# Patient Record
Sex: Female | Born: 2000 | Race: Black or African American | Hispanic: No | Marital: Single | Smoking: Never smoker
Health system: Southern US, Community
[De-identification: ages and names within clinical notes are randomized; demographics above are authoritative.]

## PROBLEM LIST (undated history)

## (undated) ENCOUNTER — Emergency Department: Disposition: A | Discharge status: Home / Self Care | Payer: Exclusive Provider Organization

## (undated) ENCOUNTER — Ambulatory Visit (INDEPENDENT_AMBULATORY_CARE_PROVIDER_SITE_OTHER): Admission: RE | Payer: Self-pay

## (undated) DIAGNOSIS — Z889 Allergy status to unspecified drugs, medicaments and biological substances status: Secondary | ICD-10-CM

## (undated) DIAGNOSIS — M1990 Unspecified osteoarthritis, unspecified site: Secondary | ICD-10-CM

## (undated) DIAGNOSIS — Z9189 Other specified personal risk factors, not elsewhere classified: Secondary | ICD-10-CM

## (undated) DIAGNOSIS — S22000S Wedge compression fracture of unspecified thoracic vertebra, sequela: Secondary | ICD-10-CM

## (undated) DIAGNOSIS — Z87828 Personal history of other (healed) physical injury and trauma: Secondary | ICD-10-CM

## (undated) DIAGNOSIS — T7840XA Allergy, unspecified, initial encounter: Secondary | ICD-10-CM

## (undated) DIAGNOSIS — J383 Other diseases of vocal cords: Secondary | ICD-10-CM

## (undated) DIAGNOSIS — Q828 Other specified congenital malformations of skin: Secondary | ICD-10-CM

## (undated) DIAGNOSIS — L900 Lichen sclerosus et atrophicus: Secondary | ICD-10-CM

## (undated) HISTORY — PX: APPENDECTOMY (OPEN): SHX54

---
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
## 2018-04-30 ENCOUNTER — Emergency Department (HOSPITAL_COMMUNITY)
Admission: EM | Admit: 2018-04-30 | Discharge: 2018-05-01 | Disposition: A | Discharge status: Home / Self Care | Payer: Managed Care, Other (non HMO) | Attending: Emergency Medicine | Admitting: Emergency Medicine

## 2018-04-30 ENCOUNTER — Encounter: Payer: Self-pay | Admitting: Emergency Medicine

## 2018-04-30 ENCOUNTER — Emergency Department (HOSPITAL_COMMUNITY): Payer: Managed Care, Other (non HMO)

## 2018-04-30 ENCOUNTER — Other Ambulatory Visit: Payer: Self-pay

## 2018-04-30 DIAGNOSIS — M79672 Pain in left foot: Secondary | ICD-10-CM | POA: Diagnosis present

## 2018-04-30 NOTE — ED Provider Notes (Signed)
[facility]   CSN: [id number] Arrival date & time: 04/30/18  2055     History   Chief Complaint Chief Complaint  Patient presents with  . Foot Pain    HPI Carmen JamesKatherine Walsh is a 18 y.o. female.   18 year old female presents to the emergency department for evaluation of pain to her left foot.  She states that she was walking in high heels 1 week ago for many hours and began noticing some discomfort the following morning.  Pain is aggravated with ambulation.  She has not taken NSAIDs as she has a history of gastritis.  Also denies use of Tylenol.  She has applied ice without relief.  Patient denies any associated numbness.  She has had some intermittent paresthesias in her toes.  Is concerned that she may have a stress fracture.    The history is provided by the patient. No language interpreter was used.  Foot Pain     Past Medical History:  Diagnosis Date  . Arthritis   . H/O multiple allergies     There are no active problems to display for this patient.   No past surgical history on file.   OB History   No obstetric history on file.      Home Medications    Prior to Admission medications   Medication Sig Start Date End Date Taking? Authorizing Provider  diclofenac sodium (VOLTAREN) 1 % GEL Apply 2 g topically 4 (four) times daily. 05/01/18   Antony MaduraHumes, Yisroel Mullendore, PA-C  mometasone-formoterol (DULERA) 100-5 MCG/ACT AERO Inhale 2 puffs into the lungs 2 (two) times daily.    [provider]    Family History No family history on file.  Social History Social History   Tobacco Use  . Smoking status: Never Smoker  . Smokeless tobacco: Never Used  Substance Use Topics  . Alcohol use: No    Frequency: Never  . Drug use: No     Allergies   Other and Prednisone   Review of Systems Review of Systems Ten systems reviewed and are negative for acute change, except as noted in the HPI.    Physical  Exam Updated Vital Signs BP 110/70   Pulse 72   Temp 98.8 F (37.1 C) (Oral)   Resp 18   LMP 04/06/2018   SpO2 100%   Physical Exam Vitals signs and nursing note reviewed.  Constitutional:      General: She is not in acute distress.    Appearance: She is well-developed. She is not diaphoretic.     Comments: Nontoxic appearing and in NAD  HENT:     Head: Normocephalic and atraumatic.  Eyes:     General: No scleral icterus.    Conjunctiva/sclera: Conjunctivae normal.  Neck:     Musculoskeletal: Normal range of motion.  Cardiovascular:     Rate and Rhythm: Normal rate and regular rhythm.     Pulses: Normal pulses.     Comments: DP pulse 2+ in the left lower extremity.  Capillary refill brisk in all digits of the left foot. Pulmonary:     Effort: Pulmonary effort is normal. No respiratory distress.  Musculoskeletal: Normal range of motion.        General: Tenderness present.     Comments: Tenderness to palpation to the left MTP extending to the proximal great toe.  No bony deformity or crepitus.  No associated erythema, swelling, induration.  No heat to touch.  Skin:  General: Skin is warm and dry.     Coloration: Skin is not pale.     Findings: No erythema or rash.  Neurological:     Mental Status: She is alert and oriented to person, place, and time.     Comments: Sensation to light touch intact.  Patient able to wiggle all toes.  Psychiatric:        Behavior: Behavior normal.      ED Treatments / Results  Labs (all labs ordered are listed, but only abnormal results are displayed) Labs Reviewed - No data to display  EKG None  Radiology Dg Foot 2 Views Left  Result Date: 05/01/2018 CLINICAL DATA:  Left foot pain for 1-1/2 weeks. EXAM: LEFT FOOT - 2 VIEW COMPARISON:  None. FINDINGS: There is no evidence of fracture or dislocation. There is no evidence of arthropathy or other focal bone abnormality. Incidental bipartite tibial sesamoid. Soft tissues are  unremarkable. IMPRESSION: Negative radiographs of the left foot. Electronically Signed   By: Narda Rutherford M.D.   On: 05/01/2018 00:08    Procedures Procedures (including critical care time)  Medications Ordered in ED Medications  acetaminophen (TYLENOL) tablet 1,000 mg (1,000 mg Oral Given 05/01/18 0048)     Initial Impression / Assessment and Plan / ED Course  I have reviewed the triage vital signs and the nursing notes.  Pertinent labs & imaging results that were available during my care of the patient were reviewed by me and considered in my medical decision making (see chart for details).     Patient presents to the emergency department for evaluation of L foot pain. Patient neurovascularly intact on exam. Imaging negative for fracture, dislocation, bony deformity. No swelling, erythema, heat to touch to the affected area; no concern for septic joint. Compartments in the affected extremity are soft. Plan for supportive management including RICE and NSAIDs; primary care follow up as needed. Return precautions discussed and provided. Patient discharged in stable condition with no unaddressed concerns.   Final Clinical Impressions(s) / ED Diagnoses   Final diagnoses:  Left foot pain    ED Discharge Orders         Ordered    diclofenac sodium (VOLTAREN) 1 % GEL  4 times daily     05/01/18 0035           Antony Madura, PA-C 05/01/18 0054    Cathren Laine, MD 05/03/18 1153

## 2018-04-30 NOTE — ED Triage Notes (Signed)
Onset 1 1/2 weeks ago pt was wearing high heels, since then bottom of left foot painful.  Foot warm to touch, no bruising or swelling noted. Pain worse with walking.

## 2018-04-30 NOTE — ED Notes (Signed)
Pt complains of foot pain for 1 week. Pt reports twisting or manipulating her foot while wearing some platform shoes. Pt is able walk but states it hurts.

## 2018-05-01 MED ORDER — ACETAMINOPHEN 500 MG PO TABS
1000.0000 mg | ORAL_TABLET | Freq: Once | ORAL | Status: AC
  Administered 2018-05-01: 1000 mg via ORAL
  Filled 2018-05-01: qty 2

## 2018-05-01 MED ORDER — DICLOFENAC SODIUM 1 % TD GEL: 2.0000 g | Freq: Four times a day (QID) | TRANSDERMAL | 0 refills | Status: AC

## 2018-05-01 NOTE — Discharge Instructions (Signed)
Your x-ray did not show any evidence of broken bone.  We recommend application of topical Voltaren gel.  Continue with icing, rest, elevation.  Follow-up with a primary care doctor as needed.

## 2019-02-12 ENCOUNTER — Other Ambulatory Visit: Payer: Self-pay

## 2019-02-12 ENCOUNTER — Encounter (HOSPITAL_COMMUNITY): Payer: Self-pay

## 2019-02-12 ENCOUNTER — Emergency Department (HOSPITAL_COMMUNITY)
Admission: EM | Admit: 2019-02-12 | Discharge: 2019-02-13 | Disposition: A | Discharge status: Home / Self Care | Payer: Managed Care, Other (non HMO) | Attending: Emergency Medicine | Admitting: Emergency Medicine

## 2019-02-12 DIAGNOSIS — H9201 Otalgia, right ear: Secondary | ICD-10-CM | POA: Diagnosis present

## 2019-02-12 DIAGNOSIS — H6011 Cellulitis of right external ear: Secondary | ICD-10-CM | POA: Diagnosis not present

## 2019-02-12 DIAGNOSIS — Z79899 Other long term (current) drug therapy: Secondary | ICD-10-CM | POA: Insufficient documentation

## 2019-02-12 LAB — CBC WITH DIFFERENTIAL/PLATELET
Abs Immature Granulocytes: 0.02 10*3/uL (ref 0.00–0.07)
Basophils Absolute: 0.1 10*3/uL (ref 0.0–0.1)
Basophils Relative: 1 %
Eosinophils Absolute: 0.3 10*3/uL (ref 0.0–0.5)
Eosinophils Relative: 4 %
HCT: 41 % (ref 36.0–46.0)
Hemoglobin: 13.3 g/dL (ref 12.0–15.0)
Immature Granulocytes: 0 %
Lymphocytes Relative: 27 %
Lymphs Abs: 2 10*3/uL (ref 0.7–4.0)
MCH: 30 pg (ref 26.0–34.0)
MCHC: 32.4 g/dL (ref 30.0–36.0)
MCV: 92.3 fL (ref 80.0–100.0)
Monocytes Absolute: 0.4 10*3/uL (ref 0.1–1.0)
Monocytes Relative: 5 %
Neutro Abs: 4.7 10*3/uL (ref 1.7–7.7)
Neutrophils Relative %: 63 %
Platelets: 246 10*3/uL (ref 150–400)
RBC: 4.44 MIL/uL (ref 3.87–5.11)
RDW: 11.2 % — ABNORMAL LOW (ref 11.5–15.5)
WBC: 7.5 10*3/uL (ref 4.0–10.5)
nRBC: 0 % (ref 0.0–0.2)

## 2019-02-12 LAB — COMPREHENSIVE METABOLIC PANEL
ALT: 15 U/L (ref 0–44)
AST: 17 U/L (ref 15–41)
Albumin: 3.8 g/dL (ref 3.5–5.0)
Alkaline Phosphatase: 45 U/L (ref 38–126)
Anion gap: 7 (ref 5–15)
BUN: 8 mg/dL (ref 6–20)
CO2: 25 mmol/L (ref 22–32)
Calcium: 9.1 mg/dL (ref 8.9–10.3)
Chloride: 107 mmol/L (ref 98–111)
Creatinine, Ser: 0.66 mg/dL (ref 0.44–1.00)
GFR calc Af Amer: >60 mL/min (ref >60)
GFR calc non Af Amer: >60 mL/min (ref >60)
Glucose, Bld: 95 mg/dL (ref 70–99)
Potassium: 4 mmol/L (ref 3.5–5.1)
Sodium: 139 mmol/L (ref 135–145)
Total Bilirubin: 0.6 mg/dL (ref 0.3–1.2)
Total Protein: 6.7 g/dL (ref 6.5–8.1)

## 2019-02-12 LAB — LACTIC ACID, PLASMA: Lactic Acid, Venous: 1 mmol/L (ref 0.5–1.9)

## 2019-02-12 MED ORDER — SODIUM CHLORIDE 0.9% FLUSH
3.0000 mL | Freq: Once | INTRAVENOUS | Status: AC
  Administered 2019-02-13: 3 mL via INTRAVENOUS

## 2019-02-12 NOTE — ED Triage Notes (Signed)
Patient with cellulitis to right ear with redness , drainage and drainage. Taking clindamycin with minimal relief. Has numbness down right cheek . Patient finished amoxicillin for strep

## 2019-02-13 ENCOUNTER — Emergency Department (HOSPITAL_COMMUNITY): Payer: Managed Care, Other (non HMO)

## 2019-02-13 MED ORDER — KETOROLAC TROMETHAMINE 30 MG/ML IJ SOLN
30.0000 mg | Freq: Once | INTRAMUSCULAR | Status: AC
  Administered 2019-02-13: 30 mg via INTRAVENOUS
  Filled 2019-02-13: qty 1

## 2019-02-13 MED ORDER — IOHEXOL 300 MG/ML  SOLN
75.0000 mL | Freq: Once | INTRAMUSCULAR | Status: AC | PRN
  Administered 2019-02-13: 75 mL via INTRAVENOUS

## 2019-02-13 MED ORDER — HYDROCODONE-ACETAMINOPHEN 5-325 MG PO TABS: 1.0000 | ORAL_TABLET | ORAL | 0 refills | Status: AC | PRN

## 2019-02-13 MED ORDER — CIPROFLOXACIN HCL 500 MG PO TABS: 500.0000 mg | ORAL_TABLET | Freq: Two times a day (BID) | ORAL | 0 refills | Status: DC

## 2019-02-13 MED ORDER — CIPROFLOXACIN HCL 500 MG PO TABS
500.0000 mg | ORAL_TABLET | Freq: Once | ORAL | Status: AC
  Administered 2019-02-13: 07:00:00 500 mg via ORAL
  Filled 2019-02-13: qty 1

## 2019-02-13 NOTE — Discharge Instructions (Signed)
Take the prescribed medication as directed.  Do not drive while taking pain medication, it can make you drowsy. Follow-up with ENT-- call their office today to confirm appt time.  They are expecting you. Return to the ED for new or worsening symptoms.

## 2019-02-13 NOTE — ED Notes (Signed)
Patient was educated on wound care for her ear and advised that if symptoms worsen to return to the ER. Pt verbalized understanding via teach back method. C/o no pain at this time.

## 2019-02-13 NOTE — ED Provider Notes (Signed)
[facility] EMERGENCY DEPARTMENT Provider Note   CSN: [id number] Arrival date & time: 02/12/19  1727     History   Chief Complaint Chief Complaint  Patient presents with   Recurrent Skin Infections    HPI Carmen Walsh is a 18 y.o. female.     The history is provided by the patient and medical records.     18 year old female with history of arthritis, presenting to the ED with right ear pain and swelling which began on Thursday, 3 days ago.  She started noticing some drainage from the outer ear on Saturday and it has progressively worsened.  She was previously on amoxicillin for strep throat but was switched to clindamycin, has been taking that for 2 days now.  She has not noticed any improvement.  She denies fever or complete hearing loss, but does state her hearing is starting to sound muffled.  Over the past 24 hours majority of her pain is now localized behind the right ear.  She had 2 piercings of her right pinna that she removed due to the swelling.  She has one of her tragus, this has been in place for about 3 years and has not had any complications with this.  She denies any other new piercings or tattoos.  She is not diabetic and has no immunocompromise state.  Past Medical History:  Diagnosis Date   Arthritis    H/O multiple allergies     There are no active problems to display for this patient.   History reviewed. No pertinent surgical history.   OB History   No obstetric history on file.      Home Medications    Prior to Admission medications   Medication Sig Start Date End Date Taking? Authorizing Provider  diclofenac sodium (VOLTAREN) 1 % GEL Apply 2 g topically 4 (four) times daily. 05/01/18   Antony Madura, PA-C  mometasone-formoterol (DULERA) 100-5 MCG/ACT AERO Inhale 2 puffs into the lungs 2 (two) times daily.    [provider]    Family History No family history on file.  Social History Social History    Tobacco Use   Smoking status: Never Smoker   Smokeless tobacco: Never Used  Substance Use Topics   Alcohol use: No    Frequency: Never   Drug use: No     Allergies   Other and Prednisone   Review of Systems Review of Systems  HENT: Positive for ear pain.   All other systems reviewed and are negative.    Physical Exam Updated Vital Signs BP 102/67 (BP Location: Left Arm)    Pulse 83    Temp 98.6 F (37 C) (Oral)    Resp 19    SpO2 100%   Physical Exam Vitals signs and nursing note reviewed.  Constitutional:      Appearance: She is well-developed.  HENT:     Head: Normocephalic and atraumatic.     Left Ear: Tympanic membrane and ear canal normal.     Ears:     Comments: Right external ear is diffusely swollen, erythematous, and has numerous small pustules draining yellow fluid, swelling and tenderness of the mastoid bone; ear canal itself does not seem overly swollen, no significant pain when otoscope placed into ear, no drainage from inside canal See photos below Eyes:     Conjunctiva/sclera: Conjunctivae normal.     Pupils: Pupils are equal, round, and reactive to light.  Neck:     Musculoskeletal: Normal range of  motion.  Cardiovascular:     Rate and Rhythm: Normal rate and regular rhythm.     Heart sounds: Normal heart sounds.  Pulmonary:     Effort: Pulmonary effort is normal.     Breath sounds: Normal breath sounds.  Abdominal:     General: Bowel sounds are normal.     Palpations: Abdomen is soft.  Musculoskeletal: Normal range of motion.  Skin:    General: Skin is warm and dry.  Neurological:     Mental Status: She is alert and oriented to person, place, and time.            ED Treatments / Results  Labs (all labs ordered are listed, but only abnormal results are displayed) Labs Reviewed  CBC WITH DIFFERENTIAL/PLATELET - Abnormal; Notable for the following components:      Result Value   RDW 11.2 (*)    All other components within  normal limits  LACTIC ACID, PLASMA  COMPREHENSIVE METABOLIC PANEL    EKG None  Radiology Ct Temporal Bones W Contrast  Result Date: 02/13/2019 CLINICAL DATA:  Right postauricular swelling for 4 days. Cellulitis of the pinna with drainage from the ear. Mastoiditis. EXAM: CT TEMPORAL BONES WITH CONTRAST TECHNIQUE: Axial and coronal plane CT imaging of the petrous temporal bones was performed with thin-collimation image reconstruction after intravenous contrast administration. Multiplanar CT image reconstructions were also generated. CONTRAST:  75mL OMNIPAQUE IOHEXOL 300 MG/ML  SOLN COMPARISON:  None. FINDINGS: The visualized intracranial contents are normal. No acute infarct, hemorrhage, or mass lesion is present. Ventricles are of normal size. No significant extra-axial fluid collection is present. The paranasal sinuses and mastoid air cells are clear. The globes and orbits are within normal limits. The right external auditory canal is within normal limits. The tympanic membrane is visualized and appears to be intact. The middle ear ossicles are normally formed and articulating. Middle ear cavity is clear. Epitympanum unremarkable. The mastoid air cells are clear. Oval window is patent. The inner ear structures are normally formed. The superior semicircular canal is covered. The internal auditory canal and vestibular aqueduct are within normal limits. Extensive soft tissue swelling is present adjacent to the right mastoid, posterior and inferior to the right ear. There is no discrete abscess or mass lesion. There is some skin thickening. The left external auditory canal is within normal limits. The tympanic membrane is visualized and appears to be intact. The middle ear ossicles are normally formed and articulating. Middle ear cavity is clear. Epitympanum unremarkable. The mastoid air cells are clear. Oval window is patent. The inner ear structures are normally formed. The superior semicircular canal is  covered. The internal auditory canal and vestibular aqueduct are within normal limits. IMPRESSION: 1. Extensive soft tissue swelling adjacent to the right mastoid, posterior and inferior to the right ear without a discrete abscess or mass lesion. Findings are consistent with focal cellulitis. There is no underlying osseous abnormality, mastoid effusion, or temporal bone etiology. 2. Normal CT appearance of the temporal bones bilaterally. 3. Normal CT appearance of the brain. Electronically Signed   By: Marin Robertshristopher  Mattern M.D.   On: 02/13/2019 06:53    Procedures Procedures (including critical care time)  Medications Ordered in ED Medications  ciprofloxacin (CIPRO) tablet 500 mg (has no administration in time range)  sodium chloride flush (NS) 0.9 % injection 3 mL (3 mLs Intravenous Given 02/13/19 0542)  ketorolac (TORADOL) 30 MG/ML injection 30 mg (30 mg Intravenous Given 02/13/19 0543)  iohexol (OMNIPAQUE) 300  MG/ML solution 75 mL (75 mLs Intravenous Contrast Given 02/13/19 4680)     Initial Impression / Assessment and Plan / ED Course  I have reviewed the triage vital signs and the nursing notes.  Pertinent labs & imaging results that were available during my care of the patient were reviewed by me and considered in my medical decision making (see chart for details).  18 y.o. F here with 4 days of right ear pain, swelling, and new drainage over the last 24 hours.  Recently on amoxicillin for strep throat, then switched to clindamycin.  She is afebrile, non-toxic.  Right ear externally is diffusely swollen, erythematous, and draining yellow fluid.  She does not have any significant swelling or drainage of the EAC and no pain with insertion of otoscope.  She does however have pain and tenderness over the mastoid, mild swelling noted there as well.  Labs obtained from triage are overall reassuring.  Concern for possible malignant otitis externa vs mastoiditis or similar.  Will get CT of the  temporal bones with contrast for further evaluation.  CT with findings of focal cellulitis of the ear, no findings of mastoiditis or abscess formation.  Discussed with ENT, Dr. Redmond Baseman-- recommend to switch to Ciprofloxacin for 10 days for coverage of pseudomonas and she can follow-up in clinic later this week.    Patient is afebrile, non-toxic with reassuring labs so feel OP management is reasonable and patient is comfortable with this.  First dose of ciprofloxacin given here, patient will follow-up closely in clinic.  Given strict return precautions for any new/acute changes.  Final Clinical Impressions(s) / ED Diagnoses   Final diagnoses:  Cellulitis of right ear    ED Discharge Orders    None       Larene Pickett, PA-C 02/13/19 3212    Ripley Fraise, MD 02/13/19 [phone]

## 2019-02-13 NOTE — ED Notes (Signed)
Pt transported to CT ?

## 2019-02-13 NOTE — ED Provider Notes (Signed)
Patient seen/examined in the Emergency Department in conjunction with Midlevel Provider [facility] Patient reports right ear pain swelling, she has been clindamycin without improvement of infection Exam : Awake alert, patient has evidence of infection to right ear with edema noted Plan: We will proceed with CT imaging.  Concern for malignant otitis externa.   Ripley Fraise, MD 02/13/19 [phone]

## 2019-05-26 ENCOUNTER — Ambulatory Visit: Payer: Managed Care, Other (non HMO) | Attending: Internal Medicine

## 2019-05-26 DIAGNOSIS — Z23 Encounter for immunization: Secondary | ICD-10-CM

## 2019-05-26 NOTE — Progress Notes (Signed)
   Covid-19 Vaccination Clinic  Name:  Carmen Walsh    MRN: [id number] DOB: 05/05/2000  05/26/2019  Ms. Rather was observed post Covid-19 immunization for 15 minutes without incidence. She was provided with Vaccine Information Sheet and instruction to access the V-Safe system.   [name] was instructed to call 911 with any severe reactions post vaccine: Marland Kitchen Difficulty breathing  . Swelling of your face and throat  . A fast heartbeat  . A bad rash all over your body  . Dizziness and weakness    Immunizations Administered    Name Date Dose VIS Date Route   Pfizer COVID-19 Vaccine 05/26/2019  2:11 PM 0.3 mL 03/17/2019 Intramuscular   Manufacturer: [employer], [employer]   Lot: [id number]   NDC: [address]-6

## 2019-06-20 ENCOUNTER — Ambulatory Visit: Payer: Managed Care, Other (non HMO) | Attending: Internal Medicine

## 2019-06-20 DIAGNOSIS — Z23 Encounter for immunization: Secondary | ICD-10-CM

## 2019-06-20 NOTE — Progress Notes (Signed)
   Covid-19 Vaccination Clinic  Name:  Carmen Walsh    MRN: [id number] DOB: June 25, 2000  06/20/2019  [name] was observed post Covid-19 immunization for 15 minutes without incident. She was provided with Vaccine Information Sheet and instruction to access the V-Safe system.   [name] was instructed to call 911 with any severe reactions post vaccine: Marland Kitchen Difficulty breathing  . Swelling of face and throat  . A fast heartbeat  . A bad rash all over body  . Dizziness and weakness   Immunizations Administered    Name Date Dose VIS Date Route   Pfizer COVID-19 Vaccine 06/20/2019  8:34 AM 0.3 mL 03/17/2019 Intramuscular   Manufacturer: [employer], [employer]   Lot: [id number]   NDC: [address]-9

## 2020-02-07 ENCOUNTER — Ambulatory Visit (HOSPITAL_COMMUNITY)
Admission: EM | Admit: 2020-02-07 | Discharge: 2020-02-07 | Disposition: A | Discharge status: Home / Self Care | Payer: Managed Care, Other (non HMO) | Attending: Family Medicine | Admitting: Family Medicine

## 2020-02-07 ENCOUNTER — Encounter (HOSPITAL_COMMUNITY): Payer: Self-pay

## 2020-02-07 ENCOUNTER — Other Ambulatory Visit: Payer: Self-pay

## 2020-02-07 DIAGNOSIS — N390 Urinary tract infection, site not specified: Secondary | ICD-10-CM | POA: Insufficient documentation

## 2020-02-07 LAB — POCT URINALYSIS DIPSTICK, ED / UC
Bilirubin Urine: NEGATIVE
Glucose, UA: 100 mg/dL — AB
Ketones, ur: NEGATIVE mg/dL
Nitrite: NEGATIVE
Protein, ur: 100 mg/dL — AB
Specific Gravity, Urine: 1.015 (ref 1.005–1.030)
Urobilinogen, UA: 0.2 mg/dL (ref 0.0–1.0)
pH: 5.5 (ref 5.0–8.0)

## 2020-02-07 LAB — POC URINE PREG, ED: Preg Test, Ur: NEGATIVE

## 2020-02-07 MED ORDER — NITROFURANTOIN MONOHYD MACRO 100 MG PO CAPS: 100.0000 mg | ORAL_CAPSULE | Freq: Two times a day (BID) | ORAL | 0 refills | Status: AC

## 2020-02-07 NOTE — Discharge Instructions (Addendum)
Treating you for a urinary tract infection Take the medicine as prescribed Drink plenty of water.  Follow up as needed for continued or worsening symptoms  

## 2020-02-07 NOTE — ED Triage Notes (Signed)
Pt presents with burning during urination, pelvic pressure, and urinary frequency X 2 days.

## 2020-02-07 NOTE — ED Triage Notes (Signed)
Pt also complains of flank pain and nausea.

## 2020-02-08 LAB — URINE CULTURE

## 2020-02-09 LAB — URINE CULTURE: Culture: 40000 — AB

## 2020-02-09 NOTE — ED Provider Notes (Signed)
[facility]    CSN: [id number] Arrival date & time: 02/07/20  1327      History   Chief Complaint Chief Complaint  Patient presents with   Urinary Tract Infection    HPI Carmen Walsh is a 19 y.o. female.   Patient is a 19 year old female presents today with dysuria, pelvic pressure, urinary urgency for 2 days. She is also has some mild flank pain and nausea. No vomiting or fevers. No vaginal symptoms. Patient's last menstrual period was 01/15/2020.      Past Medical History:  Diagnosis Date   Arthritis    H/O multiple allergies     There are no problems to display for this patient.   History reviewed. No pertinent surgical history.  OB History   No obstetric history on file.      Home Medications    Prior to Admission medications   Medication Sig Start Date End Date Taking? Authorizing Provider  diclofenac sodium (VOLTAREN) 1 % GEL Apply 2 g topically 4 (four) times daily. 05/01/18   Antony Madura, PA-C  HYDROcodone-acetaminophen (NORCO/VICODIN) 5-325 MG tablet Take 1 tablet by mouth every 4 (four) hours as needed. 02/13/19   Garlon Hatchet, PA-C  mometasone-formoterol (DULERA) 100-5 MCG/ACT AERO Inhale 2 puffs into the lungs 2 (two) times daily.    [provider]  nitrofurantoin, macrocrystal-monohydrate, (MACROBID) 100 MG capsule Take 1 capsule (100 mg total) by mouth 2 (two) times daily. 02/07/20   Janace Aris, NP    Family History Family History  Family history unknown: Yes    Social History Social History   Tobacco Use   Smoking status: Never Smoker   Smokeless tobacco: Never Used  Substance Use Topics   Alcohol use: No   Drug use: No     Allergies   Other, Triptans, and Prednisone   Review of Systems Review of Systems   Physical Exam Triage Vital Signs ED Triage Vitals  Enc Vitals Group     BP 02/07/20 1350 108/65     Pulse Rate 02/07/20 1350 92     Resp 02/07/20 1350 18     Temp 02/07/20 1350  99 F (37.2 C)     Temp Source 02/07/20 1350 Oral     SpO2 02/07/20 1350 100 %     Weight --      Height --      Head Circumference --      Peak Flow --      Pain Score 02/07/20 1348 6     Pain Loc --      Pain Edu? --      Excl. in GC? --    No data found.  Updated Vital Signs BP 108/65 (BP Location: Right Arm)    Pulse 92    Temp 99 F (37.2 C) (Oral)    Resp 18    LMP 01/15/2020    SpO2 100%   Visual Acuity Right Eye Distance:   Left Eye Distance:   Bilateral Distance:    Right Eye Near:   Left Eye Near:    Bilateral Near:     Physical Exam Vitals and nursing note reviewed.  Constitutional:      General: She is not in acute distress.    Appearance: Normal appearance. She is not ill-appearing, toxic-appearing or diaphoretic.  HENT:     Head: Normocephalic.     Nose: Nose normal.  Eyes:     Conjunctiva/sclera: Conjunctivae normal.  Pulmonary:  Effort: Pulmonary effort is normal.  Abdominal:     Tenderness: There is no right CVA tenderness or left CVA tenderness.  Musculoskeletal:        General: Normal range of motion.     Cervical back: Normal range of motion.  Skin:    General: Skin is warm and dry.     Findings: No rash.  Neurological:     Mental Status: She is alert.  Psychiatric:        Mood and Affect: Mood normal.      UC Treatments / Results  Labs (all labs ordered are listed, but only abnormal results are displayed) Labs Reviewed  URINE CULTURE - Abnormal; Notable for the following components:      Result Value   Culture   (*)    Value: 40,000 COLONIES/mL ESCHERICHIA COLI SUSCEPTIBILITIES TO FOLLOW Performed at [facility], [address], [place], [place] [address]    All other components within normal limits  POCT URINALYSIS DIPSTICK, ED / UC - Abnormal; Notable for the following components:   Glucose, UA 100 (*)    Hgb urine dipstick LARGE (*)    Protein, ur 100 (*)    Leukocytes,Ua SMALL (*)    All other components  within normal limits  POC URINE PREG, ED    EKG   Radiology No results found.  Procedures Procedures (including critical care time)  Medications Ordered in UC Medications - No data to display  Initial Impression / Assessment and Plan / UC Course  I have reviewed the triage vital signs and the nursing notes.  Pertinent labs & imaging results that were available during my care of the patient were reviewed by me and considered in my medical decision making (see chart for details).     Lower UTI Urine with small lueks and large hgb Sending for culture  Treating with Macrobid.  Push fluids.  Follow up as needed for continued or worsening symptoms  Final Clinical Impressions(s) / UC Diagnoses   Final diagnoses:  Lower urinary tract infectious disease     Discharge Instructions     Treating you for a urinary tract infection.  Take the medicine as prescribed Drink plenty of water.  Follow up as needed for continued or worsening symptoms     ED Prescriptions    Medication Sig Dispense Auth. Provider   nitrofurantoin, macrocrystal-monohydrate, (MACROBID) 100 MG capsule Take 1 capsule (100 mg total) by mouth 2 (two) times daily. 10 capsule Dahlia Byes A, NP     PDMP not reviewed this encounter.   Janace Aris, NP 02/09/20 [phone]

## 2020-04-17 ENCOUNTER — Ambulatory Visit: Payer: Managed Care, Other (non HMO) | Attending: Family

## 2020-04-17 DIAGNOSIS — Z23 Encounter for immunization: Secondary | ICD-10-CM

## 2020-06-04 IMAGING — CT CT TEMPORAL BONES W/ CM
3 of 8 series · 15 of 40 positions shown, 17 images · IV contrast (omnipaque)
Comparison: None.

CLINICAL DATA: Right postauricular swelling for 4 days. Cellulitis
of the pinna with drainage from the ear. Mastoiditis.

EXAM:
CT TEMPORAL BONES WITH CONTRAST
TECHNIQUE: Axial and coronal plane CT imaging of the petrous temporal bones was
performed with thin-collimation image reconstruction after
intravenous contrast administration. Multiplanar CT image
reconstructions were also generated.
CONTRAST:  75mL OMNIPAQUE IOHEXOL 300 MG/ML  SOLN

[Series 4: temp bone bilat bone 0.6 uq77 · axial · 0.34mm/px · z∈[-34,+13]mm · 7 of 106 slices shown, 9 images]
[im 14/106  brain]
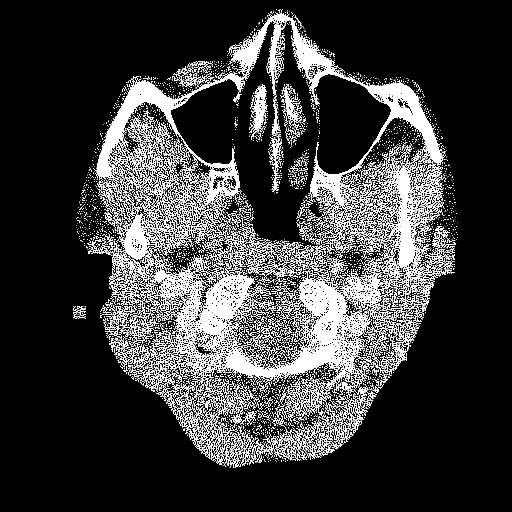
[im 14/106  bone]
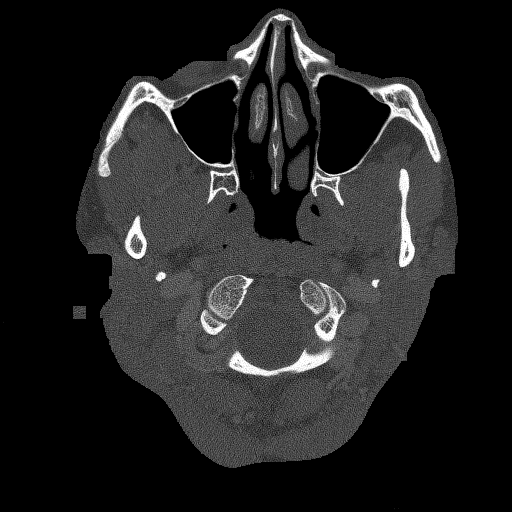
[im 27/106  bone]
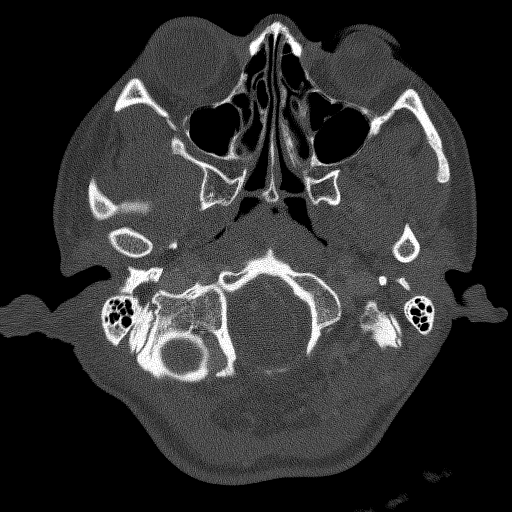
[im 40/106  bone]
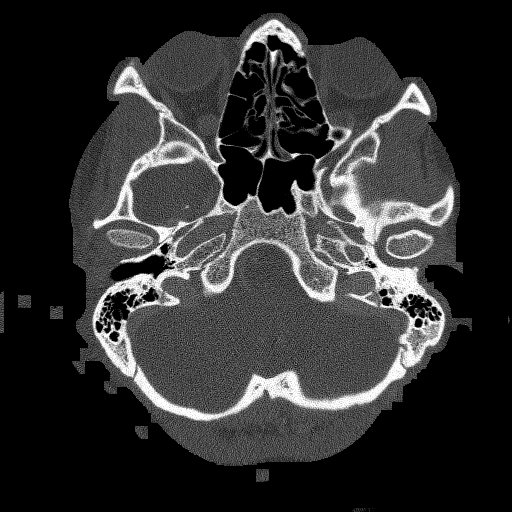
[im 53/106  bone]
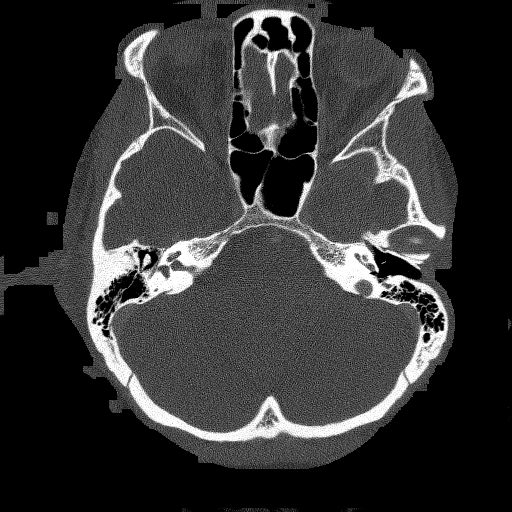
[im 66/106  brain]
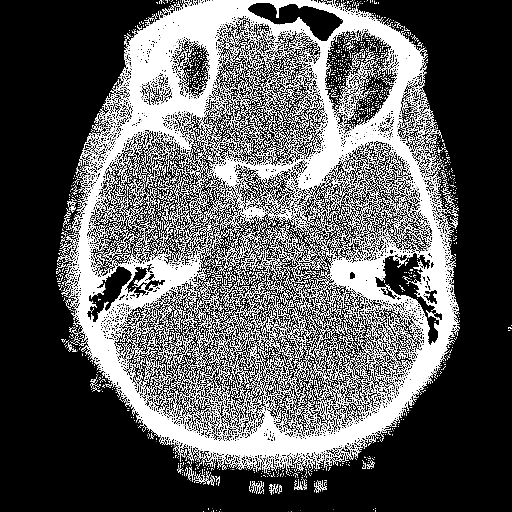
[im 66/106  bone]
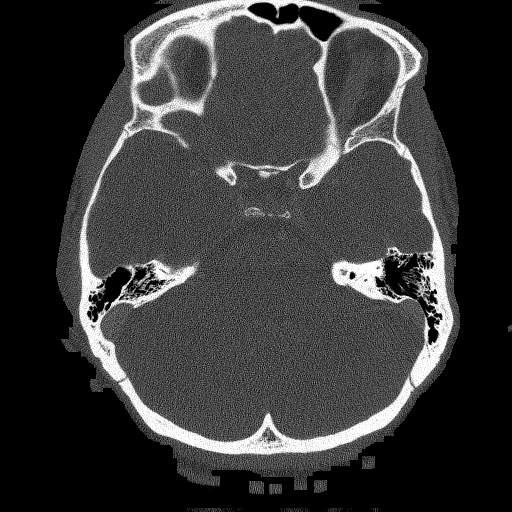
[im 79/106  bone]
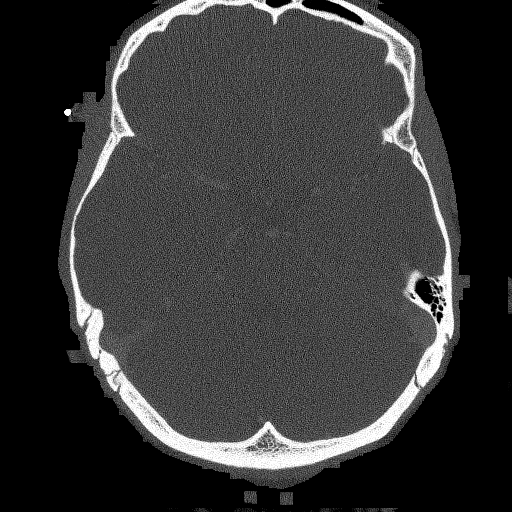
[im 92/106  bone]
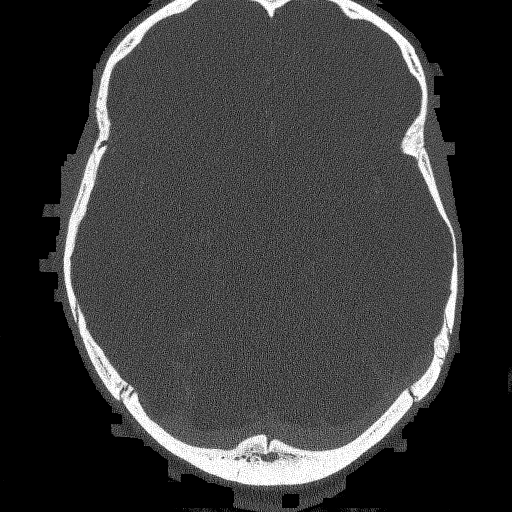

[Series 5: temp bone axial mag rt · axial · 0.17mm/px · z∈[-34,+13]mm · 7 of 106 slices shown]
[im 14/106  bone]
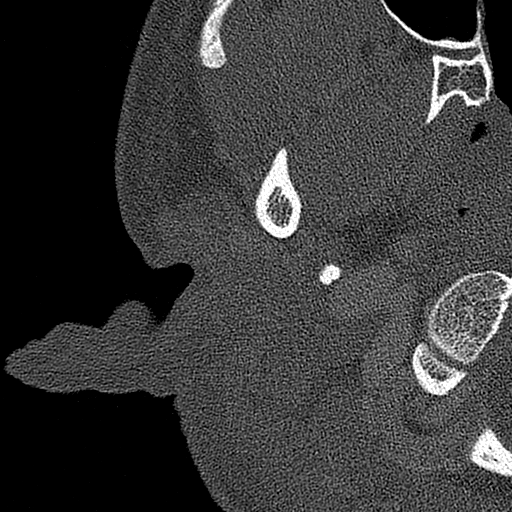
[im 27/106  bone]
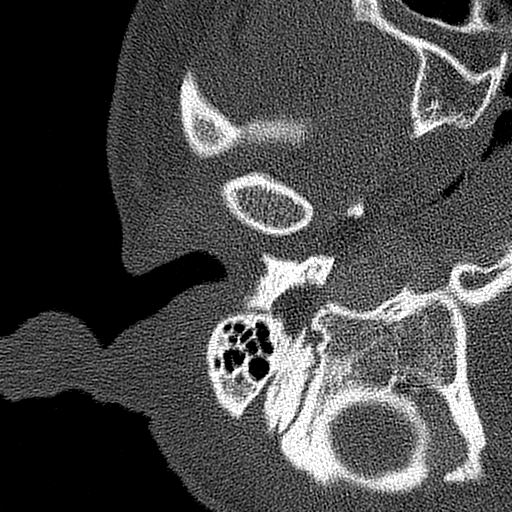
[im 40/106  bone]
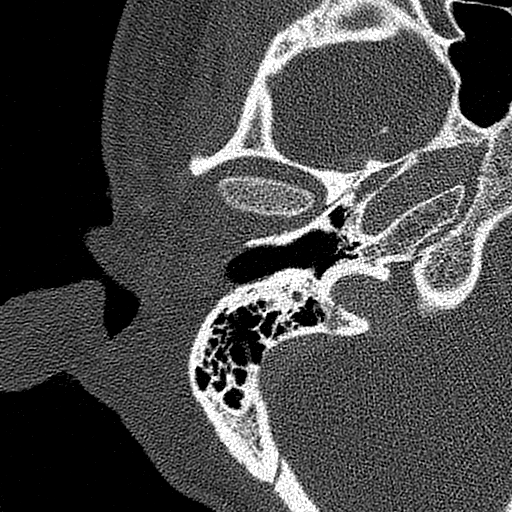
[im 53/106  bone]
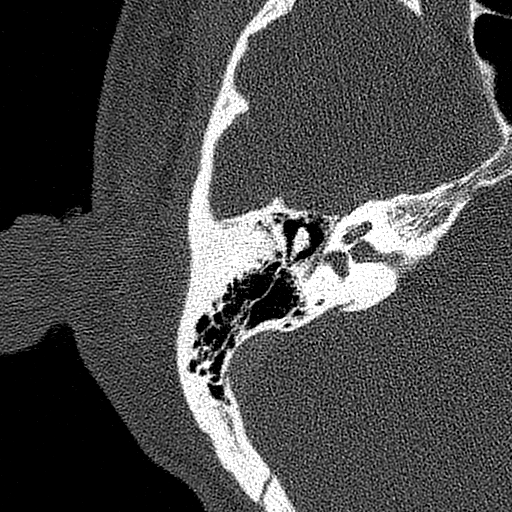
[im 66/106  bone]
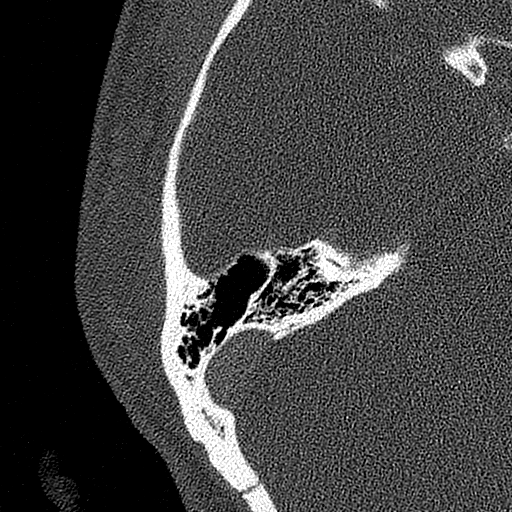
[im 79/106  bone]
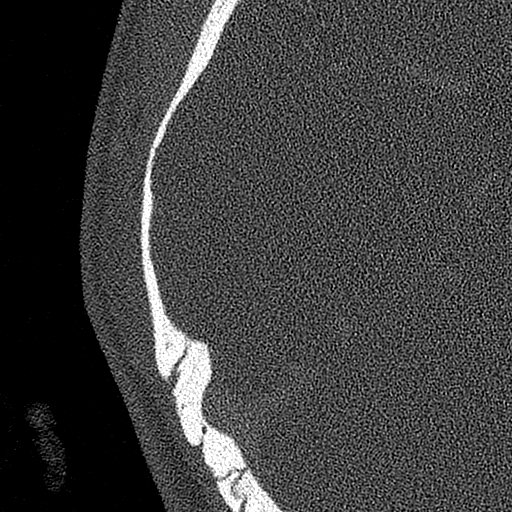
[im 92/106  bone]
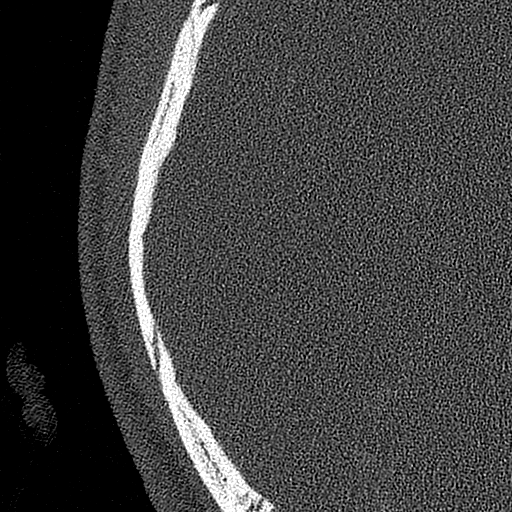

[Series 8: temp bone bilat coronal bone · coronal · 0.18mm/px · 1 of 308 slices shown]
[im 154/308  bone]
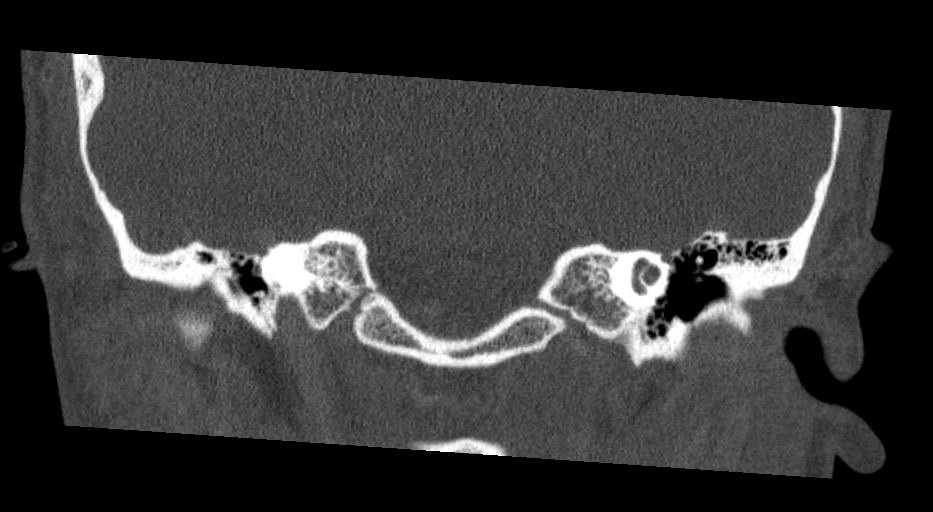

[15 of 40 positions shown; findings below may reference images not displayed]

FINDINGS: The visualized intracranial contents are normal. No acute infarct,
hemorrhage, or mass lesion is present. Ventricles are of normal
size. No significant extra-axial fluid collection is present.

The paranasal sinuses and mastoid air cells are clear. The globes
and orbits are within normal limits.

The right external auditory canal is within normal limits. The
tympanic membrane is visualized and appears to be intact. The middle
ear ossicles are normally formed and articulating. Middle ear cavity
is clear. Epitympanum unremarkable. The mastoid air cells are clear.
Oval window is patent. The inner ear structures are normally formed.
The superior semicircular canal is covered. The internal auditory
canal and vestibular aqueduct are within normal limits.

Extensive soft tissue swelling is present adjacent to the right
mastoid, posterior and inferior to the right ear. There is no
discrete abscess or mass lesion. There is some skin thickening.

The left external auditory canal is within normal limits. The
tympanic membrane is visualized and appears to be intact. The middle
ear ossicles are normally formed and articulating. Middle ear cavity
is clear. Epitympanum unremarkable. The mastoid air cells are clear.
Oval window is patent. The inner ear structures are normally formed.
The superior semicircular canal is covered. The internal auditory
canal and vestibular aqueduct are within normal limits.
IMPRESSION: 1. Extensive soft tissue swelling adjacent to the right mastoid,
posterior and inferior to the right ear without a discrete abscess
or mass lesion. Findings are consistent with focal cellulitis. There
is no underlying osseous abnormality, mastoid effusion, or temporal
bone etiology.
2. Normal CT appearance of the temporal bones bilaterally.
3. Normal CT appearance of the brain.

## 2020-08-16 NOTE — Progress Notes (Signed)
   Covid-19 Vaccination Clinic  Name:  Alanie Syler    MRN: [id number] DOB: December 12, 2000  08/16/2020  Ms. Joshi was observed post Covid-19 immunization for 15 minutes without incident. She was provided with Vaccine Information Sheet and instruction to access the V-Safe system.   Ms. Ruddell was instructed to call 911 with any severe reactions post vaccine: Marland Kitchen Difficulty breathing  . Swelling of face and throat  . A fast heartbeat  . A bad rash all over body  . Dizziness and weakness   Immunizations Administered    Name Date Dose VIS Date Route   Moderna Covid-19 Booster Vaccine 04/17/2020 12:15 PM 0.25 mL 01/24/2020 Intramuscular   Manufacturer: Moderna   Lot: [id number]   NDC: 53664-403-47
